# Patient Record
Sex: Male | Born: 1990 | Race: White | Hispanic: No | Marital: Married | Smoking: Light tobacco smoker
Health system: Southern US, Community
[De-identification: ages and names within clinical notes are randomized; demographics above are authoritative.]

---

## 2020-06-21 ENCOUNTER — Other Ambulatory Visit: Payer: Self-pay

## 2020-06-22 ENCOUNTER — Other Ambulatory Visit: Payer: Self-pay

## 2020-06-22 ENCOUNTER — Encounter: Payer: Self-pay | Admitting: Family Medicine

## 2020-06-22 ENCOUNTER — Ambulatory Visit (INDEPENDENT_AMBULATORY_CARE_PROVIDER_SITE_OTHER): Payer: Managed Care, Other (non HMO) | Admitting: Family Medicine

## 2020-06-22 VITALS — BP 120/76 | HR 96 | Temp 97.9°F | Ht 72.0 in | Wt 170.4 lb

## 2020-06-22 DIAGNOSIS — Z0000 Encounter for general adult medical examination without abnormal findings: Secondary | ICD-10-CM | POA: Diagnosis not present

## 2020-06-22 DIAGNOSIS — G8929 Other chronic pain: Secondary | ICD-10-CM

## 2020-06-22 DIAGNOSIS — M25511 Pain in right shoulder: Secondary | ICD-10-CM

## 2020-06-22 LAB — COMPREHENSIVE METABOLIC PANEL
ALT: 9 U/L (ref 0–53)
AST: 14 U/L (ref 0–37)
Albumin: 4.8 g/dL (ref 3.5–5.2)
Alkaline Phosphatase: 56 U/L (ref 39–117)
BUN: 9 mg/dL (ref 6–23)
CO2: 29 mEq/L (ref 19–32)
Calcium: 10.1 mg/dL (ref 8.4–10.5)
Chloride: 102 mEq/L (ref 96–112)
Creatinine, Ser: 0.88 mg/dL (ref 0.40–1.50)
GFR: 102.25 mL/min (ref >60.00)
Glucose, Bld: 92 mg/dL (ref 70–99)
Potassium: 4.2 mEq/L (ref 3.5–5.1)
Sodium: 138 mEq/L (ref 135–145)
Total Bilirubin: 0.8 mg/dL (ref 0.2–1.2)
Total Protein: 7.7 g/dL (ref 6.0–8.3)

## 2020-06-22 LAB — URINALYSIS, ROUTINE W REFLEX MICROSCOPIC
Bilirubin Urine: NEGATIVE
Hgb urine dipstick: NEGATIVE
Ketones, ur: NEGATIVE
Leukocytes,Ua: NEGATIVE
Nitrite: NEGATIVE
RBC / HPF: NONE SEEN (ref 0–?)
Specific Gravity, Urine: <=1.005 — AB (ref 1.000–1.030)
Total Protein, Urine: NEGATIVE
Urine Glucose: NEGATIVE
Urobilinogen, UA: 0.2 (ref 0.0–1.0)
WBC, UA: NONE SEEN (ref 0–?)
pH: 7 (ref 5.0–8.0)

## 2020-06-22 LAB — CBC
HCT: 46.2 % (ref 39.0–52.0)
Hemoglobin: 15.4 g/dL (ref 13.0–17.0)
MCHC: 33.4 g/dL (ref 30.0–36.0)
MCV: 92.9 fl (ref 78.0–100.0)
Platelets: 266 10*3/uL (ref 150.0–400.0)
RBC: 4.97 Mil/uL (ref 4.22–5.81)
RDW: 13.4 % (ref 11.5–15.5)
WBC: 5.5 10*3/uL (ref 4.0–10.5)

## 2020-06-22 LAB — LIPID PANEL
Cholesterol: 186 mg/dL (ref 0–200)
HDL: 78.3 mg/dL (ref >39.00)
LDL Cholesterol: 96 mg/dL (ref 0–99)
NonHDL: 107.4
Total CHOL/HDL Ratio: 2
Triglycerides: 56 mg/dL (ref 0.0–149.0)
VLDL: 11.2 mg/dL (ref 0.0–40.0)

## 2020-06-22 LAB — LDL CHOLESTEROL, DIRECT: Direct LDL: 94 mg/dL

## 2020-06-22 NOTE — Progress Notes (Signed)
New Patient Office Visit  Subjective:  Patient ID: James Davila, male    DOB: [birth year]  Age: 29 y.o. MRN: [id number]  CC:  Chief Complaint  Patient presents with  . Establish Care    New patient     HPI James Davila presents for establishment of care and a physical exam and discuss some medical issues and concerns.  Enjoys good health.  He has been having chronic right shoulder pain.  He is right-hand dominant.  There is relatively new stress in his life with a new job working for 10-hour days, recently married.  His father passed this past year and had suffered from malignant melanoma.  Patient does smoke but does so rarely.  He drinks alcohol 2-3 times weekly at most and no more than 1 or 2 servings in a given setting.  Having trouble finding time to exercise with his current work schedule.  Sometimes has problem with acne on his upper back.  History reviewed. No pertinent past medical history.  History reviewed. No pertinent surgical history.  Family History  Problem Relation Age of Onset  . Healthy Mother   . Pneumonia Father     Social History   Socioeconomic History  . Marital status: Married    Spouse name: Not on file  . Number of children: Not on file  . Years of education: Not on file  . Highest education level: Not on file  Occupational History  . Not on file  Tobacco Use  . Smoking status: Light Tobacco Smoker    Types: Cigarettes  . Smokeless tobacco: Never Used  Vaping Use  . Vaping Use: Never used  Substance and Sexual Activity  . Alcohol use: Yes    Comment: all drinks  . Drug use: Never  . Sexual activity: Yes  Other Topics Concern  . Not on file  Social History Narrative  . Not on file   Social Determinants of Health   Financial Resource Strain:   . Difficulty of Paying Living Expenses: Not on file  Food Insecurity:   . Worried About Programme researcher, broadcasting/film/video in the Last Year: Not on file  . Ran Out of Food in the Last Year: Not on file    Transportation Needs:   . Lack of Transportation (Medical): Not on file  . Lack of Transportation (Non-Medical): Not on file  Physical Activity:   . Days of Exercise per Week: Not on file  . Minutes of Exercise per Session: Not on file  Stress:   . Feeling of Stress : Not on file  Social Connections:   . Frequency of Communication with Friends and Family: Not on file  . Frequency of Social Gatherings with Friends and Family: Not on file  . Attends Religious Services: Not on file  . Active Member of Clubs or Organizations: Not on file  . Attends Banker Meetings: Not on file  . Marital Status: Not on file  Intimate Partner Violence:   . Fear of Current or Ex-Partner: Not on file  . Emotionally Abused: Not on file  . Physically Abused: Not on file  . Sexually Abused: Not on file    ROS Review of Systems  Constitutional: Negative.   HENT: Negative.   Eyes: Negative for photophobia and visual disturbance.  Respiratory: Negative.   Cardiovascular: Negative.   Gastrointestinal: Negative.   Endocrine: Negative for polyphagia and polyuria.  Genitourinary: Negative.   Musculoskeletal: Positive for arthralgias. Negative for gait problem and joint  swelling.  Allergic/Immunologic: Negative for immunocompromised state.  Neurological: Negative.   Hematological: Does not bruise/bleed easily.    Objective:   Today's Vitals: BP 120/76   Pulse 96   Temp 97.9 F (36.6 C) (Tympanic)   Ht 6' (1.829 m)   Wt 170 lb 6.4 oz (77.3 kg)   SpO2 98%   BMI 23.11 kg/m   Physical Exam Vitals and nursing note reviewed.  Constitutional:      General: He is not in acute distress.    Appearance: Normal appearance. He is normal weight. He is not ill-appearing, toxic-appearing or diaphoretic.  HENT:     Head: Normocephalic and atraumatic.     Right Ear: Tympanic membrane, ear canal and external ear normal.     Left Ear: Tympanic membrane, ear canal and external ear normal.      Mouth/Throat:     Mouth: Mucous membranes are moist.     Pharynx: Oropharynx is clear. No oropharyngeal exudate or posterior oropharyngeal erythema.  Eyes:     General: No scleral icterus.       Right eye: No discharge.        Left eye: No discharge.     Extraocular Movements: Extraocular movements intact.     Conjunctiva/sclera: Conjunctivae normal.     Pupils: Pupils are equal, round, and reactive to light.  Cardiovascular:     Rate and Rhythm: Normal rate and regular rhythm.  Pulmonary:     Effort: Pulmonary effort is normal.     Breath sounds: Normal breath sounds.  Abdominal:     General: Abdomen is flat. Bowel sounds are normal. There is no distension.     Palpations: There is no mass.     Tenderness: There is no abdominal tenderness. There is no guarding or rebound.     Hernia: No hernia is present. There is no hernia in the left inguinal area or right inguinal area.  Genitourinary:    Penis: No hypospadias, erythema, tenderness, discharge, swelling or lesions.      Testes: Normal.        Right: Mass, tenderness or swelling not present. Right testis is descended.        Left: Mass, tenderness or swelling not present. Left testis is descended.     Epididymis:     Right: Not inflamed or enlarged.     Left: Not inflamed or enlarged.  Musculoskeletal:     Cervical back: Normal range of motion. No rigidity or tenderness.     Right lower leg: No edema.     Left lower leg: No edema.  Lymphadenopathy:     Cervical: No cervical adenopathy.     Lower Body: No right inguinal adenopathy. No left inguinal adenopathy.  Skin:    General: Skin is warm and dry.  Neurological:     Mental Status: He is alert and oriented to person, place, and time.  Psychiatric:        Mood and Affect: Mood normal.        Behavior: Behavior normal.     Assessment & Plan:   Problem List Items Addressed This Visit      Other   Chronic right shoulder pain   Relevant Orders   Ambulatory referral  to Sports Medicine   Healthcare maintenance - Primary   Relevant Orders   CBC   Comprehensive metabolic panel   LDL cholesterol, direct   Lipid panel   Urinalysis, Routine w reflex microscopic   HIV Antibody (routine testing  w rflx)   Hepatitis C antibody      No outpatient encounter medications on file as of 06/22/2020.   No facility-administered encounter medications on file as of 06/22/2020.    Follow-up: Return in about 1 year (around 06/22/2021), or if symptoms worsen or fail to improve.  Given information on health maintenance disease prevention and mindfulness based stress reduction.  Asked him to try to find time for at least 30 minutes of exercise 5 days weekly. Mliss Sax, MD

## 2020-06-22 NOTE — Patient Instructions (Signed)
Health Maintenance, Male Adopting a healthy lifestyle and getting preventive care are important in promoting health and wellness. Ask your health care provider about:  The right schedule for you to have regular tests and exams.  Things you can do on your own to prevent diseases and keep yourself healthy. What should I know about diet, weight, and exercise? Eat a healthy diet   Eat a diet that includes plenty of vegetables, fruits, low-fat dairy products, and lean protein.  Do not eat a lot of foods that are high in solid fats, added sugars, or sodium. Maintain a healthy weight Body mass index (BMI) is a measurement that can be used to identify possible weight problems. It estimates body fat based on height and weight. Your health care provider can help determine your BMI and help you achieve or maintain a healthy weight. Get regular exercise Get regular exercise. This is one of the most important things you can do for your health. Most adults should:  Exercise for at least 150 minutes each week. The exercise should increase your heart rate and make you sweat (moderate-intensity exercise).  Do strengthening exercises at least twice a week. This is in addition to the moderate-intensity exercise.  Spend less time sitting. Even light physical activity can be beneficial. Watch cholesterol and blood lipids Have your blood tested for lipids and cholesterol at 29 years of age, then have this test every 5 years. You may need to have your cholesterol levels checked more often if:  Your lipid or cholesterol levels are high.  You are older than 29 years of age.  You are at high risk for heart disease. What should I know about cancer screening? Many types of cancers can be detected early and may often be prevented. Depending on your health history and family history, you may need to have cancer screening at various ages. This may include screening for:  Colorectal cancer.  Prostate  cancer.  Skin cancer.  Lung cancer. What should I know about heart disease, diabetes, and high blood pressure? Blood pressure and heart disease  High blood pressure causes heart disease and increases the risk of stroke. This is more likely to develop in people who have high blood pressure readings, are of African descent, or are overweight.  Talk with your health care provider about your target blood pressure readings.  Have your blood pressure checked: ? Every 3-5 years if you are 18-39 years of age. ? Every year if you are 40 years old or older.  If you are between the ages of 65 and 75 and are a current or former smoker, ask your health care provider if you should have a one-time screening for abdominal aortic aneurysm (AAA). Diabetes Have regular diabetes screenings. This checks your fasting blood sugar level. Have the screening done:  Once every three years after age 45 if you are at a normal weight and have a low risk for diabetes.  More often and at a younger age if you are overweight or have a high risk for diabetes. What should I know about preventing infection? Hepatitis B If you have a higher risk for hepatitis B, you should be screened for this virus. Talk with your health care provider to find out if you are at risk for hepatitis B infection. Hepatitis C Blood testing is recommended for:  Everyone born from 1945 through 1965.  Anyone with known risk factors for hepatitis C. Sexually transmitted infections (STIs)  You should be screened each year   for STIs, including gonorrhea and chlamydia, if: ? You are sexually active and are younger than 29 years of age. ? You are older than 29 years of age and your health care provider tells you that you are at risk for this type of infection. ? Your sexual activity has changed since you were last screened, and you are at increased risk for chlamydia or gonorrhea. Ask your health care provider if you are at risk.  Ask your  health care provider about whether you are at high risk for HIV. Your health care provider may recommend a prescription medicine to help prevent HIV infection. If you choose to take medicine to prevent HIV, you should first get tested for HIV. You should then be tested every 3 months for as long as you are taking the medicine. Follow these instructions at home: Lifestyle  Do not use any products that contain nicotine or tobacco, such as cigarettes, e-cigarettes, and chewing tobacco. If you need help quitting, ask your health care provider.  Do not use street drugs.  Do not share needles.  Ask your health care provider for help if you need support or information about quitting drugs. Alcohol use  Do not drink alcohol if your health care provider tells you not to drink.  If you drink alcohol: ? Limit how much you have to 0-2 drinks a day. ? Be aware of how much alcohol is in your drink. In the [place] one drink equals one 12 oz bottle of beer (355 mL), one 5 oz glass of wine (148 mL), or one 1 oz glass of hard liquor (44 mL). General instructions  Schedule regular health, dental, and eye exams.  Stay current with your vaccines.  Tell your health care provider if: ? You often feel depressed. ? You have ever been abused or do not feel safe at home. Summary  Adopting a healthy lifestyle and getting preventive care are important in promoting health and wellness.  Follow your health care provider's instructions about healthy diet, exercising, and getting tested or screened for diseases.  Follow your health care provider's instructions on monitoring your cholesterol and blood pressure. This information is not intended to replace advice given to you by your health care provider. Make sure you discuss any questions you have with your health care provider. Document Revised: 10/06/2018 Document Reviewed: 10/06/2018 Elsevier Patient Education  2020 [employer] Inc[employer]  Preventive Care 21-39 Years  Old, Male Preventive care refers to lifestyle choices and visits with your health care provider that can promote health and wellness. This includes:  A yearly physical exam. This is also called an annual well check.  Regular dental and eye exams.  Immunizations.  Screening for certain conditions.  Healthy lifestyle choices, such as eating a healthy diet, getting regular exercise, not using drugs or products that contain nicotine and tobacco, and limiting alcohol use. What can I expect for my preventive care visit? Physical exam Your health care provider will check:  Height and weight. These may be used to calculate body mass index (BMI), which is a measurement that tells if you are at a healthy weight.  Heart rate and blood pressure.  Your skin for abnormal spots. Counseling Your health care provider may ask you questions about:  Alcohol, tobacco, and drug use.  Emotional well-being.  Home and relationship well-being.  Sexual activity.  Eating habits.  Work and work environment. What immunizations do I need?  Influenza (flu) vaccine  This is recommended every year. Tetanus, diphtheria,   and pertussis (Tdap) vaccine  You may need a Td booster every 10 years. Varicella (chickenpox) vaccine  You may need this vaccine if you have not already been vaccinated. Human papillomavirus (HPV) vaccine  If recommended by your health care provider, you may need three doses over 6 months. Measles, mumps, and rubella (MMR) vaccine  You may need at least one dose of MMR. You may also need a second dose. Meningococcal conjugate (MenACWY) vaccine  One dose is recommended if you are 73-28 years old and a Market researcher living in a residence hall, or if you have one of several medical conditions. You may also need additional booster doses. Pneumococcal conjugate (PCV13) vaccine  You may need this if you have certain conditions and were not previously  vaccinated. Pneumococcal polysaccharide (PPSV23) vaccine  You may need one or two doses if you smoke cigarettes or if you have certain conditions. Hepatitis A vaccine  You may need this if you have certain conditions or if you travel or work in places where you may be exposed to hepatitis A. Hepatitis B vaccine  You may need this if you have certain conditions or if you travel or work in places where you may be exposed to hepatitis B. Haemophilus influenzae type b (Hib) vaccine  You may need this if you have certain risk factors. You may receive vaccines as individual doses or as more than one vaccine together in one shot (combination vaccines). Talk with your health care provider about the risks and benefits of combination vaccines. What tests do I need? Blood tests  Lipid and cholesterol levels. These may be checked every 5 years starting at age 80.  Hepatitis C test.  Hepatitis B test. Screening   Diabetes screening. This is done by checking your blood sugar (glucose) after you have not eaten for a while (fasting).  Sexually transmitted disease (STD) testing. Talk with your health care provider about your test results, treatment options, and if necessary, the need for more tests. Follow these instructions at home: Eating and drinking   Eat a diet that includes fresh fruits and vegetables, whole grains, lean protein, and low-fat dairy products.  Take vitamin and mineral supplements as recommended by your health care provider.  Do not drink alcohol if your health care provider tells you not to drink.  If you drink alcohol: ? Limit how much you have to 0-2 drinks a day. ? Be aware of how much alcohol is in your drink. In the [place] one drink equals one 12 oz bottle of beer (355 mL), one 5 oz glass of wine (148 mL), or one 1 oz glass of hard liquor (44 mL). Lifestyle  Take daily care of your teeth and gums.  Stay active. Exercise for at least 30 minutes on 5 or more days  each week.  Do not use any products that contain nicotine or tobacco, such as cigarettes, e-cigarettes, and chewing tobacco. If you need help quitting, ask your health care provider.  If you are sexually active, practice safe sex. Use a condom or other form of protection to prevent STIs (sexually transmitted infections). What's next?  Go to your health care provider once a year for a well check visit.  Ask your health care provider how often you should have your eyes and teeth checked.  Stay up to date on all vaccines. This information is not intended to replace advice given to you by your health care provider. Make sure you discuss any questions you  have with your health care provider. Document Revised: 10/07/2018 Document Reviewed: 10/07/2018 Elsevier Patient Education  2020 [employer] Inc.  Mindfulness-Based Stress Reduction Mindfulness-based stress reduction (MBSR) is a program that helps people learn to practice mindfulness. Mindfulness is the practice of intentionally paying attention to the present moment. It can be learned and practiced through techniques such as education, breathing exercises, meditation, and yoga. MBSR includes several mindfulness techniques in one program. MBSR works best when you understand the treatment, are willing to try new things, and can commit to spending time practicing what you learn. MBSR training may include learning about:  How your emotions, thoughts, and reactions affect your body.  New ways to respond to things that cause negative thoughts to start (triggers).  How to notice your thoughts and let go of them.  Practicing awareness of everyday things that you normally do without thinking.  The techniques and goals of different types of meditation. What are the benefits of MBSR? MBSR can have many benefits, which include helping you to:  Develop self-awareness. This refers to knowing and understanding yourself.  Learn skills and attitudes that  help you to participate in your own health care.  Learn new ways to care for yourself.  Be more accepting about how things are, and let things go.  Be less judgmental and approach things with an open mind.  Be patient with yourself and trust yourself more. MBSR has also been shown to:  Reduce negative emotions, such as depression and anxiety.  Improve memory and focus.  Change how you sense and approach pain.  Boost your body's ability to fight infections.  Help you connect better with other people.  Improve your sense of well-being. Follow these instructions at home:   Find a local in-person or online MBSR program.  Set aside some time regularly for mindfulness practice.  Find a mindfulness practice that works best for you. This may include one or more of the following: ? Meditation. Meditation involves focusing your mind on a certain thought or activity. ? Breathing awareness exercises. These help you to stay present by focusing on your breath. ? Body scan. For this practice, you lie down and pay attention to each part of your body from head to toe. You can identify tension and soreness and intentionally relax parts of your body. ? Yoga. Yoga involves stretching and breathing, and it can improve your ability to move and be flexible. It can also provide an experience of testing your body's limits, which can help you release stress. ? Mindful eating. This way of eating involves focusing on the taste, texture, color, and smell of each bite of food. Because this slows down eating and helps you feel full sooner, it can be an important part of a weight-loss plan.  Find a podcast or recording that provides guidance for breathing awareness, body scan, or meditation exercises. You can listen to these any time when you have a free moment to rest without distractions.  Follow your treatment plan as told by your health care provider. This may include taking regular medicines and making  changes to your diet or lifestyle as recommended. How to practice mindfulness To do a basic awareness exercise:  Find a comfortable place to sit.  Pay attention to the present moment. Observe your thoughts, feelings, and surroundings just as they are.  Avoid placing judgment on yourself, your feelings, or your surroundings. Make note of any judgment that comes up, and let it go.  Your mind may wander,   and that is okay. Make note of when your thoughts drift, and return your attention to the present moment. To do basic mindfulness meditation:  Find a comfortable place to sit. This may include a stable chair or a firm floor cushion. ? Sit upright with your back straight. Let your arms fall next to your side with your hands resting on your legs. ? If sitting in a chair, rest your feet flat on the floor. ? If sitting on a cushion, cross your legs in front of you.  Keep your head in a neutral position with your chin dropped slightly. Relax your jaw and rest the tip of your tongue on the roof of your mouth. Drop your gaze to the floor. You can close your eyes if you like.  Breathe normally and pay attention to your breath. Feel the air moving in and out of your nose. Feel your belly expanding and relaxing with each breath.  Your mind may wander, and that is okay. Make note of when your thoughts drift, and return your attention to your breath.  Avoid placing judgment on yourself, your feelings, or your surroundings. Make note of any judgment or feelings that come up, let them go, and bring your attention back to your breath.  When you are ready, lift your gaze or open your eyes. Pay attention to how your body feels after the meditation. Where to find more information You can find more information about MBSR from:  Your health care provider.  Community-based meditation centers or programs.  Programs offered near you. Summary  Mindfulness-based stress reduction (MBSR) is a program that  teaches you how to intentionally pay attention to the present moment. It is used with other treatments to help you cope better with daily stress, emotions, and pain.  MBSR focuses on developing self-awareness, which allows you to respond to life stress without judgment or negative emotions.  MBSR programs may involve learning different mindfulness practices, such as breathing exercises, meditation, yoga, body scan, or mindful eating. Find a mindfulness practice that works best for you, and set aside time for it on a regular basis. This information is not intended to replace advice given to you by your health care provider. Make sure you discuss any questions you have with your health care provider. Document Revised: 09/25/2017 Document Reviewed: 02/19/2017 Elsevier Patient Education  2020 [employer] Inc.  

## 2020-06-25 LAB — HEPATITIS C ANTIBODY
Hepatitis C Ab: NONREACTIVE
SIGNAL TO CUT-OFF: 0.01 (ref <1.00)

## 2020-06-25 LAB — HIV ANTIBODY (ROUTINE TESTING W REFLEX): HIV 1&2 Ab, 4th Generation: NONREACTIVE

## 2020-07-05 ENCOUNTER — Encounter: Payer: Self-pay | Admitting: Family Medicine

## 2020-07-05 ENCOUNTER — Ambulatory Visit: Payer: Self-pay

## 2020-07-05 ENCOUNTER — Ambulatory Visit (INDEPENDENT_AMBULATORY_CARE_PROVIDER_SITE_OTHER): Payer: Managed Care, Other (non HMO)

## 2020-07-05 ENCOUNTER — Other Ambulatory Visit: Payer: Self-pay

## 2020-07-05 ENCOUNTER — Ambulatory Visit (INDEPENDENT_AMBULATORY_CARE_PROVIDER_SITE_OTHER): Payer: Managed Care, Other (non HMO) | Admitting: Family Medicine

## 2020-07-05 VITALS — BP 120/76 | HR 86 | Ht 72.0 in | Wt 169.4 lb

## 2020-07-05 DIAGNOSIS — M25511 Pain in right shoulder: Secondary | ICD-10-CM

## 2020-07-05 DIAGNOSIS — M25311 Other instability, right shoulder: Secondary | ICD-10-CM | POA: Diagnosis not present

## 2020-07-05 DIAGNOSIS — G8929 Other chronic pain: Secondary | ICD-10-CM

## 2020-07-05 NOTE — Patient Instructions (Addendum)
Thank you for coming in today.  Plan for PT.  Get xray today.  Recheck in 6 weeks as needed.

## 2020-07-05 NOTE — Progress Notes (Signed)
Subjective:    I'm seeing this patient as a consultation for:  Dr. Doreene Burke. Note will be routed back to referring provider/PCP.  CC: R shoulder pain  I, Molly Weber, LAT, ATC, am serving as scribe for Dr. Clementeen Graham.  HPI: Pt is a 29 y/o male presenting w/ c/o chronic R shoulder pain.  He states that he is not having any pain in his R shoulder currently but notes that he has "instability" in his shoulder despite not subluxing or dislocating his shoulder.  He locates the pain to his R post shoulder when she has it.  He likes to rock climb, mountain bike, play VB.  R shoulder mechanical symptoms: yes Aggravating factors: Nothing currently Treatments tried: self-prescribed HEP; ice; IBU  Past medical history, Surgical history, Family history, Social history, Allergies, and medications have been entered into the medical record, reviewed.   Review of Systems: No new headache, visual changes, nausea, vomiting, diarrhea, constipation, dizziness, abdominal pain, skin rash, fevers, chills, night sweats, weight loss, swollen lymph nodes, body aches, joint swelling, muscle aches, chest pain, shortness of breath, mood changes, visual or auditory hallucinations.   Objective:    Vitals:   07/05/20 0946  BP: 120/76  Pulse: 86  SpO2: 98%   General: Well Developed, well nourished, and in no acute distress.  Neuro/Psych: Alert and oriented x3, extra-ocular muscles intact, able to move all 4 extremities, sensation grossly intact. Skin: Warm and dry, no rashes noted.  Respiratory: Not using accessory muscles, speaking in full sentences, trachea midline.  Cardiovascular: Pulses palpable, no extremity edema. Abdomen: Does not appear distended. MSK: Right shoulder normal-appearing normal motion nontender. Palpable click with shoulder abduction and external rotation. Intact strength abduction external/internal rotation. Negative anterior posterior apprehension test. Negative Hawkins and Neer's test.   Negative empty can test. Negative Yergason's and speeds test. Positive O'Brien's test.  Lab and Radiology Results Diagnostic Limited MSK Ultrasound of: Right shoulder Biceps tendon intact normal-appearing Subscapularis tendon normal-appearing Supraspinatus tendon normal-appearing Small subacromial bursitis present. Infraspinatus tendon normal-appearing AC joint normal-appearing Impression: Small subacromial bursitis otherwise normal  X-ray images right shoulder obtained today personally independently reviewed No fractures or significant degenerative changes normal-appearing. Await formal radiology review    Impression and Recommendations:    Assessment and Plan: 29 y.o. male with right shoulder pain and instability.  Functionally a little bit instability with significant shoulder dominant activity like rockclimbing.  He should do quite well with physical therapy for shoulder stabilization.  Referral ordered today.  If not better next step would be MRI arthrogram to further characterize shoulder instability and look for labral tear.Marland Kitchen  PDMP not reviewed this encounter. Orders Placed This Encounter  Procedures  . [place] LIMITED JOINT SPACE STRUCTURES UP RIGHT(NO LINKED CHARGES)    Order Specific Question:   Reason for Exam (SYMPTOM  OR DIAGNOSIS REQUIRED)    Answer:   R shoulder pain    Order Specific Question:   Preferred imaging location?    Answer:   Adult [facility]  . DG Shoulder Right    Standing Status:   Future    Number of Occurrences:   1    Standing Expiration Date:   07/05/2021    Order Specific Question:   Reason for Exam (SYMPTOM  OR DIAGNOSIS REQUIRED)    Answer:   eval shoulder pain    Order Specific Question:   Preferred imaging location?    Answer:   [facility]    Order Specific Question:  Radiology Contrast Protocol - do NOT remove file path    Answer:   [email or web address]  .  Ambulatory referral to Physical Therapy    Referral Priority:   Routine    Referral Type:   Physical Medicine    Referral Reason:   Specialty Services Required    Requested Specialty:   Physical Therapy    Number of Visits Requested:   1   No orders of the defined types were placed in this encounter.   Discussed warning signs or symptoms. Please see discharge instructions. Patient expresses understanding.   The above documentation has been reviewed and is accurate and complete Clementeen Graham, M.D.

## 2020-07-06 NOTE — Progress Notes (Signed)
X-ray right shoulder looks normal-appearing to radiology.

## 2021-06-27 ENCOUNTER — Other Ambulatory Visit: Payer: Self-pay

## 2021-06-27 ENCOUNTER — Ambulatory Visit (INDEPENDENT_AMBULATORY_CARE_PROVIDER_SITE_OTHER): Payer: Managed Care, Other (non HMO) | Admitting: Family Medicine

## 2021-06-27 ENCOUNTER — Encounter: Payer: Self-pay | Admitting: Family Medicine

## 2021-06-27 VITALS — HR 93 | Temp 97.9°F | Ht 73.0 in | Wt 184.0 lb

## 2021-06-27 DIAGNOSIS — M545 Low back pain, unspecified: Secondary | ICD-10-CM | POA: Insufficient documentation

## 2021-06-27 DIAGNOSIS — R5383 Other fatigue: Secondary | ICD-10-CM | POA: Diagnosis not present

## 2021-06-27 DIAGNOSIS — F418 Other specified anxiety disorders: Secondary | ICD-10-CM

## 2021-06-27 DIAGNOSIS — Z0000 Encounter for general adult medical examination without abnormal findings: Secondary | ICD-10-CM | POA: Diagnosis not present

## 2021-06-27 LAB — COMPREHENSIVE METABOLIC PANEL
ALT: 13 U/L (ref 0–53)
AST: 15 U/L (ref 0–37)
Albumin: 4.4 g/dL (ref 3.5–5.2)
Alkaline Phosphatase: 47 U/L (ref 39–117)
BUN: 8 mg/dL (ref 6–23)
CO2: 31 mEq/L (ref 19–32)
Calcium: 9.9 mg/dL (ref 8.4–10.5)
Chloride: 102 mEq/L (ref 96–112)
Creatinine, Ser: 0.86 mg/dL (ref 0.40–1.50)
GFR: 116.44 mL/min (ref >60.00)
Glucose, Bld: 80 mg/dL (ref 70–99)
Potassium: 4.2 mEq/L (ref 3.5–5.1)
Sodium: 137 mEq/L (ref 135–145)
Total Bilirubin: 0.8 mg/dL (ref 0.2–1.2)
Total Protein: 7 g/dL (ref 6.0–8.3)

## 2021-06-27 LAB — LIPID PANEL
Cholesterol: 176 mg/dL (ref 0–200)
HDL: 61 mg/dL (ref >39.00)
LDL Cholesterol: 99 mg/dL (ref 0–99)
NonHDL: 115.49
Total CHOL/HDL Ratio: 3
Triglycerides: 80 mg/dL (ref 0.0–149.0)
VLDL: 16 mg/dL (ref 0.0–40.0)

## 2021-06-27 LAB — URINALYSIS, ROUTINE W REFLEX MICROSCOPIC
Bilirubin Urine: NEGATIVE
Hgb urine dipstick: NEGATIVE
Ketones, ur: NEGATIVE
Leukocytes,Ua: NEGATIVE
Nitrite: NEGATIVE
RBC / HPF: NONE SEEN (ref 0–?)
Specific Gravity, Urine: 1.01 (ref 1.000–1.030)
Total Protein, Urine: NEGATIVE
Urine Glucose: NEGATIVE
Urobilinogen, UA: 0.2 (ref 0.0–1.0)
WBC, UA: NONE SEEN (ref 0–?)
pH: 7.5 (ref 5.0–8.0)

## 2021-06-27 LAB — CBC
HCT: 40.8 % (ref 39.0–52.0)
Hemoglobin: 13.9 g/dL (ref 13.0–17.0)
MCHC: 34 g/dL (ref 30.0–36.0)
MCV: 91.6 fl (ref 78.0–100.0)
Platelets: 229 10*3/uL (ref 150.0–400.0)
RBC: 4.45 Mil/uL (ref 4.22–5.81)
RDW: 13.5 % (ref 11.5–15.5)
WBC: 3.9 10*3/uL — ABNORMAL LOW (ref 4.0–10.5)

## 2021-06-27 NOTE — Progress Notes (Signed)
Established Patient Office Visit  Subjective:  Patient ID: James Davila, male    DOB: Mar 21, 1991  Age: 30 y.o. MRN: [id number]  CC:  Chief Complaint  Patient presents with   Annual Exam    CPE/labs.  Fasting today.   C/o prolems focusing lately, lower back pain and wax in ears.    HPI James Davila presents for a physical exam and follow-up of some issues he has been having.  Tells of sadness anxiety decreased motivation some issue with focus.  He is married and things are going well at home.  He does not use illicit drugs.  Consumes alcohol every other night and has no more than 2 servings.  Continues to work long hours.  Gets about 7 hours of sleep a night he tells me.  He is not exercising regularly.  His wife's doctor sent her to [facility].  They put her in it seemed to help.  Tells of ongoing bilateral lower back pain.  It is nonradiating.  There is no numbness or tingling or change in his bowel or bladder function.  He requests that I check his testosterone levels.  Libido is somewhat diminished and he feels as though his energy levels are as well.  No past medical history on file.  No past surgical history on file.  Family History  Problem Relation Age of Onset   Healthy Mother    Pneumonia Father     Social History   Socioeconomic History   Marital status: Married    Spouse name: Not on file   Number of children: Not on file   Years of education: Not on file   Highest education level: Not on file  Occupational History   Not on file  Tobacco Use   Smoking status: Light Smoker    Types: Cigarettes   Smokeless tobacco: Never   Tobacco comments:    1 cigarette rarely  Vaping Use   Vaping Use: Never used  Substance and Sexual Activity   Alcohol use: Yes    Comment: 1-2 drinks 2 to 3 times weekly.    Drug use: Never   Sexual activity: Yes  Other Topics Concern   Not on file  Social History Narrative   Not on file   Social Determinants of  Health   Financial Resource Strain: Not on file  Food Insecurity: Not on file  Transportation Needs: Not on file  Physical Activity: Not on file  Stress: Not on file  Social Connections: Not on file  Intimate Partner Violence: Not on file    No outpatient medications prior to visit.   No facility-administered medications prior to visit.    No Known Allergies  ROS Review of Systems  Constitutional:  Negative for chills, diaphoresis, fatigue, fever and unexpected weight change.  HENT: Negative.    Eyes:  Negative for photophobia and visual disturbance.  Respiratory: Negative.    Cardiovascular: Negative.   Gastrointestinal: Negative.   Genitourinary: Negative.   Musculoskeletal:  Positive for back pain. Negative for gait problem.  Skin:  Negative for color change.  Neurological:  Negative for weakness and numbness.     Depression screen [facility] 2/9 06/27/2021 06/27/2021 06/22/2020  Decreased Interest 2 - 1  Down, Depressed, Hopeless 1 1 1   PHQ - 2 Score 3 1 2   Altered sleeping 2 - 1  Tired, decreased energy 3 - 2  Change in appetite 2 - 2  Feeling bad or failure about yourself  1 -  1  Trouble concentrating 3 - 1  Moving slowly or fidgety/restless 1 - 1  Suicidal thoughts 0 - 0  PHQ-9 Score 15 - 10  Difficult doing work/chores Extremely dIfficult - Somewhat difficult   GAD 7 : Generalized Anxiety Score 06/27/2021 06/22/2020  Nervous, Anxious, on Edge 1 1  Control/stop worrying 1 0  Worry too much - different things 3 2  Trouble relaxing 2 1  Restless 1 0  Easily annoyed or irritable 3 1  Afraid - awful might happen 1 0  Total GAD 7 Score 12 5  Anxiety Difficulty Very difficult Somewhat difficult      Objective:    Physical Exam Vitals and nursing note reviewed.  Constitutional:      General: He is not in acute distress.    Appearance: Normal appearance. He is normal weight. He is not ill-appearing, toxic-appearing or diaphoretic.  HENT:     Head: Normocephalic and  atraumatic.     Right Ear: Tympanic membrane, ear canal and external ear normal.     Left Ear: Tympanic membrane, ear canal and external ear normal.     Mouth/Throat:     Mouth: Mucous membranes are moist.     Pharynx: Oropharynx is clear. No oropharyngeal exudate or posterior oropharyngeal erythema.  Eyes:     General: No scleral icterus.       Right eye: No discharge.        Left eye: No discharge.     Extraocular Movements: Extraocular movements intact.     Conjunctiva/sclera: Conjunctivae normal.     Pupils: Pupils are equal, round, and reactive to light.  Cardiovascular:     Rate and Rhythm: Normal rate and regular rhythm.  Pulmonary:     Effort: Pulmonary effort is normal.     Breath sounds: Normal breath sounds.  Abdominal:     General: Abdomen is flat. Bowel sounds are normal. There is no distension.     Palpations: Abdomen is soft. There is no mass.     Tenderness: There is no abdominal tenderness. There is no guarding or rebound.     Hernia: No hernia is present.  Musculoskeletal:     Cervical back: No rigidity or tenderness.     Lumbar back: No deformity or tenderness. Normal range of motion.  Lymphadenopathy:     Cervical: No cervical adenopathy.  Skin:    General: Skin is warm and dry.  Neurological:     Mental Status: He is alert and oriented to person, place, and time.     Motor: Motor function is intact. No weakness.  Psychiatric:        Mood and Affect: Mood normal.        Behavior: Behavior normal.    Pulse 93   Temp 97.9 F (36.6 C) (Temporal)   Ht 6\' 1"  (1.854 m)   Wt 184 lb (83.5 kg)   SpO2 99%   BMI 24.28 kg/m  Wt Readings from Last 3 Encounters:  06/27/21 184 lb (83.5 kg)  07/05/20 169 lb 6.4 oz (76.8 kg)  06/22/20 170 lb 6.4 oz (77.3 kg)     Health Maintenance Due  Topic Date Due   Pneumococcal Vaccine 81-75 Years old (1 - PCV) Never done   TETANUS/TDAP  09/19/2018    There are no preventive care reminders to display for this  patient.  No results found for: TSH Lab Results  Component Value Date   WBC 5.5 06/22/2020   HGB 15.4 06/22/2020  HCT 46.2 06/22/2020   MCV 92.9 06/22/2020   PLT 266.0 06/22/2020   Lab Results  Component Value Date   NA 138 06/22/2020   K 4.2 06/22/2020   CO2 29 06/22/2020   GLUCOSE 92 06/22/2020   BUN 9 06/22/2020   CREATININE 0.88 06/22/2020   BILITOT 0.8 06/22/2020   ALKPHOS 56 06/22/2020   AST 14 06/22/2020   ALT 9 06/22/2020   PROT 7.7 06/22/2020   ALBUMIN 4.8 06/22/2020   CALCIUM 10.1 06/22/2020   GFR 102.25 06/22/2020   Lab Results  Component Value Date   CHOL 186 06/22/2020   Lab Results  Component Value Date   HDL 78.30 06/22/2020   Lab Results  Component Value Date   LDLCALC 96 06/22/2020   Lab Results  Component Value Date   TRIG 56.0 06/22/2020   Lab Results  Component Value Date   CHOLHDL 2 06/22/2020   No results found for: HGBA1C    Assessment & Plan:   Problem List Items Addressed This Visit       Other   Healthcare maintenance - Primary   Relevant Orders   CBC   Comprehensive metabolic panel   Lipid panel   Urinalysis, Routine w reflex microscopic   Anxiety with depression   Relevant Orders   Ambulatory referral to Psychology   Bilateral low back pain without sciatica   Fatigue   Relevant Orders   Testosterone Total,Free,Bio, Males-([facility])    No orders of the defined types were placed in this encounter.   Follow-up: Return in about 6 months (around 12/25/2021).  Patient was given information on health maintenance and disease prevention.  Given information on mindfulness based stress reduction.  Offered medication for his significantly positive PHQ-9 and GAD-7.  He seemed a little hesitant to consider medicine and was open to the idea of talking therapy.  Given information on back pain and exercises to relieve.  He thinks that strengthening in his core would improve his back pain.  I agree.  Mliss Sax, MD

## 2021-06-28 LAB — TESTOSTERONE TOTAL,FREE,BIO, MALES
Albumin: 4.3 g/dL (ref 3.6–5.1)
Sex Hormone Binding: 41 nmol/L (ref 10–50)
Testosterone, Bioavailable: 123.3 ng/dL (ref 110.0–575.0)
Testosterone, Free: 62.6 pg/mL (ref 46.0–224.0)
Testosterone: 545 ng/dL (ref 250–827)

## 2021-12-12 ENCOUNTER — Ambulatory Visit: Payer: Managed Care, Other (non HMO) | Admitting: Family Medicine

## 2021-12-12 ENCOUNTER — Encounter: Payer: Self-pay | Admitting: Family Medicine

## 2021-12-12 ENCOUNTER — Other Ambulatory Visit: Payer: Self-pay

## 2021-12-12 ENCOUNTER — Ambulatory Visit (INDEPENDENT_AMBULATORY_CARE_PROVIDER_SITE_OTHER): Payer: Managed Care, Other (non HMO)

## 2021-12-12 VITALS — BP 140/78 | HR 90 | Temp 97.2°F | Ht 73.0 in | Wt 176.0 lb

## 2021-12-12 DIAGNOSIS — G8929 Other chronic pain: Secondary | ICD-10-CM

## 2021-12-12 DIAGNOSIS — M545 Low back pain, unspecified: Secondary | ICD-10-CM

## 2021-12-12 DIAGNOSIS — H6120 Impacted cerumen, unspecified ear: Secondary | ICD-10-CM

## 2021-12-12 MED ORDER — MELOXICAM 7.5 MG PO TABS: 7.5000 mg | ORAL_TABLET | Freq: Every day | ORAL | 0 refills | Status: DC

## 2021-12-12 MED ORDER — CLEARCANAL EAR WAX REMOVAL 6.5 % OT KIT: PACK | OTIC | 2 refills | Status: AC

## 2021-12-12 NOTE — Progress Notes (Signed)
Established Patient Office Visit  Subjective:  Patient ID: James Davila, male    DOB: 02-18-1991  Age: 31 y.o. MRN: [id number]  CC:  Chief Complaint  Patient presents with   Back Pain    C/O back pain that come and go x few months. Would like ears checked feel clogged up.     HPI James Davila presents for evaluation of a 5-year history of ongoing lower back pain.  Usually it predominates to the left but it may be centralized down in the upper pelvis area.  Denies radiation of pain from the back into the legs.  Denies change in bowel or bladder functions.  Denies weakness or paresthesias in the lower extremities.  Knows of no specific injury.  He did start noticing lower back pain after he took a run on a steep trail with a friend of his.  Denies traumatic injury.  Works in an [employer] and really does not engage in heavy lifting.  He does workout in the gym some but says that he has not done deadlifts in years.  Admits that overall pattern of his lower back pain seems to be worsening over these last 5 years.  Assures me that his head is in a better place now.  There is less stress and anxiety in his life he has been able to work some things out.  No past medical history on file.  No past surgical history on file.  Family History  Problem Relation Age of Onset   Healthy Mother    Pneumonia Father     Social History   Socioeconomic History   Marital status: Married    Spouse name: Not on file   Number of children: Not on file   Years of education: Not on file   Highest education level: Not on file  Occupational History   Not on file  Tobacco Use   Smoking status: Light Smoker    Types: Cigarettes   Smokeless tobacco: Never   Tobacco comments:    1 cigarette rarely  Vaping Use   Vaping Use: Never used  Substance and Sexual Activity   Alcohol use: Yes    Comment: 1-2 drinks 2 to 3 times weekly.    Drug use: Never   Sexual activity: Yes  Other Topics Concern   Not on  file  Social History Narrative   Not on file   Social Determinants of Health   Financial Resource Strain: Not on file  Food Insecurity: Not on file  Transportation Needs: Not on file  Physical Activity: Not on file  Stress: Not on file  Social Connections: Not on file  Intimate Partner Violence: Not on file    Outpatient Medications Prior to Visit  Medication Sig Dispense Refill   amphetamine-dextroamphetamine (ADDERALL XR) 10 MG 24 hr capsule Take 10 mg by mouth every morning.     No facility-administered medications prior to visit.    No Known Allergies  ROS Review of Systems  Constitutional:  Negative for chills, diaphoresis, fatigue, fever and unexpected weight change.  HENT: Negative.    Eyes:  Negative for photophobia and visual disturbance.  Respiratory: Negative.    Cardiovascular: Negative.   Gastrointestinal: Negative.   Genitourinary: Negative.   Musculoskeletal:  Positive for back pain. Negative for gait problem.  Neurological:  Negative for speech difficulty, weakness and numbness.     Depression screen [facility] 2/9 12/12/2021 12/12/2021 06/27/2021  Decreased Interest 1 0 2  Down, Depressed, Hopeless 0  0 1  PHQ - 2 Score 1 0 3  Altered sleeping 1 - 2  Tired, decreased energy 1 - 3  Change in appetite 0 - 2  Feeling bad or failure about yourself  0 - 1  Trouble concentrating 1 - 3  Moving slowly or fidgety/restless 1 - 1  Suicidal thoughts 0 - 0  PHQ-9 Score 5 - 15  Difficult doing work/chores Somewhat difficult - Extremely dIfficult     Objective:    Physical Exam Vitals and nursing note reviewed.  Constitutional:      General: He is not in acute distress.    Appearance: Normal appearance. He is normal weight. He is not ill-appearing, toxic-appearing or diaphoretic.  HENT:     Head: Normocephalic and atraumatic.  Eyes:     General: No scleral icterus.       Right eye: No discharge.        Left eye: No discharge.     Extraocular Movements:  Extraocular movements intact.     Conjunctiva/sclera: Conjunctivae normal.  Cardiovascular:     Rate and Rhythm: Normal rate and regular rhythm.  Pulmonary:     Effort: Pulmonary effort is normal.     Breath sounds: Normal breath sounds.  Musculoskeletal:     Lumbar back: No spasms or tenderness. Decreased range of motion. Positive right straight leg raise test and positive left straight leg raise test.     Right lower leg: No edema.     Left lower leg: No edema.     Comments: Reports pain in the lower back with straight leg raises.  Decreased flexion, extension and rotation.  Skin:    General: Skin is warm and dry.  Neurological:     Mental Status: He is alert and oriented to person, place, and time.     Motor: No weakness.     Deep Tendon Reflexes:     Reflex Scores:      Patellar reflexes are 2+ on the right side and 2+ on the left side.      Achilles reflexes are 1+ on the right side and 1+ on the left side. Psychiatric:        Mood and Affect: Mood normal.        Behavior: Behavior normal.    BP 140/78 (BP Location: Right Arm, Patient Position: Sitting, Cuff Size: Normal)    Pulse 90    Temp (!) 97.2 F (36.2 C) (Temporal)    Ht $R'6\' 1"'qb$  (1.854 m)    Wt 176 lb (79.8 kg)    SpO2 99%    BMI 23.22 kg/m  Wt Readings from Last 3 Encounters:  12/12/21 176 lb (79.8 kg)  06/27/21 184 lb (83.5 kg)  07/05/20 169 lb 6.4 oz (76.8 kg)     There are no preventive care reminders to display for this patient.  There are no preventive care reminders to display for this patient.  No results found for: TSH Lab Results  Component Value Date   WBC 3.9 (L) 06/27/2021   HGB 13.9 06/27/2021   HCT 40.8 06/27/2021   MCV 91.6 06/27/2021   PLT 229.0 06/27/2021   Lab Results  Component Value Date   NA 137 06/27/2021   K 4.2 06/27/2021   CO2 31 06/27/2021   GLUCOSE 80 06/27/2021   BUN 8 06/27/2021   CREATININE 0.86 06/27/2021   BILITOT 0.8 06/27/2021   ALKPHOS 47 06/27/2021   AST 15  06/27/2021   ALT 13 06/27/2021  PROT 7.0 06/27/2021   ALBUMIN 4.4 06/27/2021   CALCIUM 9.9 06/27/2021   GFR 116.44 06/27/2021   Lab Results  Component Value Date   CHOL 176 06/27/2021   Lab Results  Component Value Date   HDL 61.00 06/27/2021   Lab Results  Component Value Date   LDLCALC 99 06/27/2021   Lab Results  Component Value Date   TRIG 80.0 06/27/2021   Lab Results  Component Value Date   CHOLHDL 3 06/27/2021   No results found for: HGBA1C    Assessment & Plan:   Problem List Items Addressed This Visit       Other   Chronic bilateral low back pain without sciatica - Primary   Relevant Medications   meloxicam (MOBIC) 7.5 MG tablet   Other Relevant Orders   Ambulatory referral to Sports Medicine   Cerumen in auditory canal on examination   Relevant Medications   Carbamide Peroxide-Saline (CLEARCANAL EAR WAX REMOVAL) 6.5 % KIT    Meds ordered this encounter  Medications   Carbamide Peroxide-Saline (CLEARCANAL EAR WAX REMOVAL) 6.5 % KIT    Sig: Follow instructions on kit.    Dispense:  1 kit    Refill:  2   meloxicam (MOBIC) 7.5 MG tablet    Sig: Take 1 tablet (7.5 mg total) by mouth daily.    Dispense:  30 tablet    Refill:  0    Follow-up: Return in about 6 months (around 06/11/2022).  Due to the length of symptoms we will check plain films and then an MRI of his lower back.  Sports medicine referral for guidance.  Physical therapy and ongoing management.  Information was given on chronic back pain and the management thereof.  We will start Mobic 7.5 mg daily.  Information was given on earwax buildup.  Recommend earwax removal kit with bulb.  Advised not to use Q-tips.  Continue follow-up with [facility].  Libby Maw, MD

## 2021-12-27 ENCOUNTER — Telehealth: Payer: Self-pay | Admitting: Family Medicine

## 2021-12-27 ENCOUNTER — Ambulatory Visit: Payer: Managed Care, Other (non HMO) | Admitting: Family Medicine

## 2021-12-27 NOTE — Telephone Encounter (Signed)
Pt called and said he would like to transfer care from Dr Doreene Burke to Dr Alvy Bimler because of location being closer, Is this okay?  ?

## 2021-12-28 ENCOUNTER — Ambulatory Visit (HOSPITAL_BASED_OUTPATIENT_CLINIC_OR_DEPARTMENT_OTHER): Payer: Managed Care, Other (non HMO)

## 2021-12-29 NOTE — Telephone Encounter (Signed)
Not okay with me.  I will not be accepting this transfer of care.  Thanks.

## 2022-01-02 NOTE — Progress Notes (Signed)
? ?  I, Christoper Fabian, LAT, ATC, am serving as scribe for Dr. Clementeen Graham. ? ?James Davila is a 31 y.o. male who presents to [facility] today for chronic LBP x 5 years.  He was most recently seen by his PCP for these c/o and was referred for a lumbar MRI that he has not yet completed.  He reports that he typically has chronic low back soreness but then had a more acute episode of low back pain that worsened one morning when he got out of bed.  He locates his pain to his lower back, chronically on the L side. ? ?Radiating pain: intermittently into B LE ?LE numbness/tingling: no ?Aggravating factors: hip hinge; SLR type position; general activity ?Treatments tried: meloxicam; sauna ? ?Diagnostic testing: L-spine MRI ordered but not yet completed; L-spine XR- 12/12/21 ? ? ?Pertinent review of systems: No fevers or chills ? ?Relevant historical information: Anxiety and depression. ? ? ?Exam:  ?BP 120/74 (BP Location: Right Arm, Patient Position: Sitting, Cuff Size: Normal)   Pulse 90   Ht 6\' 1"  (1.854 m)   Wt 176 lb 6.4 oz (80 kg)   SpO2 98%   BMI 23.27 kg/m?  ?General: Well Developed, well nourished, and in no acute distress.  ? ?MSK: L-spine: Nontender midline. ?Mildly tender to palpation right lumbar paraspinal musculature and SI joint region. ?Normal lumbar motion with the exception of extension which is slightly limited and mildly painful. ?Lower extremity strength is intact. ?Reflexes are intact. ? ? ? ?Lab and Radiology Results ? ?EXAM: ?LUMBAR SPINE - COMPLETE 4+ VIEW ?  ?COMPARISON:  None. ?  ?FINDINGS: ?There is no evidence of lumbar spine fracture. Alignment is normal. ?Intervertebral disc spaces are maintained. ?  ?IMPRESSION: ?Negative. ?  ?  ?Electronically Signed ?  By: M.D. ?  On: 12/13/2021 00:38 ?I, 12/15/2021, personally (independently) visualized and performed the interpretation of the images attached in this note. ? ? ? ? ?Assessment and  Plan: ?31 y.o. male with chronic low back pain ongoing for about 6 months with an acute exacerbation starting about 4 weeks ago.  The acute exacerbation is improving but the chronic pain remains.  He is a good candidate for trial of physical therapy to work on core strengthening.  If this is not sufficient to control his symptoms I would recommend an MRI as the next step.  We discussed other treatment plan and options including heating pad and a TENS unit.  Recommend holding off on muscle relaxers for now as they are sedating.  Intermittent meloxicam as needed.  This was prescribed by his PCP. ?Follow-up in about 8 weeks. ? ? ?PDMP not reviewed this encounter. ?Orders Placed This Encounter  ?Procedures  ? Ambulatory referral to Physical Therapy  ?  Referral Priority:   Routine  ?  Referral Type:   Physical Medicine  ?  Referral Reason:   Specialty Services Required  ?  Requested Specialty:   Physical Therapy  ?  Number of Visits Requested:   1  ? ?No orders of the defined types were placed in this encounter. ? ? ? ?Discussed warning signs or symptoms. Please see discharge instructions. Patient expresses understanding. ? ? ?The above documentation has been reviewed and is accurate and complete 26, M.D. ? ? ?

## 2022-01-03 ENCOUNTER — Ambulatory Visit (INDEPENDENT_AMBULATORY_CARE_PROVIDER_SITE_OTHER): Payer: Managed Care, Other (non HMO) | Admitting: Family Medicine

## 2022-01-03 ENCOUNTER — Other Ambulatory Visit: Payer: Self-pay

## 2022-01-03 ENCOUNTER — Ambulatory Visit: Payer: Managed Care, Other (non HMO) | Admitting: Sports Medicine

## 2022-01-03 ENCOUNTER — Encounter: Payer: Self-pay | Admitting: Family Medicine

## 2022-01-03 VITALS — BP 120/74 | HR 90 | Ht 73.0 in | Wt 176.4 lb

## 2022-01-03 DIAGNOSIS — M545 Low back pain, unspecified: Secondary | ICD-10-CM

## 2022-01-03 DIAGNOSIS — G8929 Other chronic pain: Secondary | ICD-10-CM

## 2022-01-03 NOTE — Patient Instructions (Addendum)
Good to see you today. ? ?I've referred you to Physical Therapy.  Their office will call you to schedule but please let us know if you haven't heard from them in one week regarding scheduling. ? ?Follow-up: 8 weeks ?

## 2022-01-10 ENCOUNTER — Other Ambulatory Visit: Payer: Self-pay | Admitting: Family Medicine

## 2022-01-10 DIAGNOSIS — M545 Low back pain, unspecified: Secondary | ICD-10-CM

## 2022-01-24 ENCOUNTER — Ambulatory Visit: Payer: Managed Care, Other (non HMO) | Admitting: Rehabilitative and Restorative Service Providers"

## 2022-01-24 ENCOUNTER — Encounter: Payer: Self-pay | Admitting: Rehabilitative and Restorative Service Providers"

## 2022-01-24 DIAGNOSIS — M6281 Muscle weakness (generalized): Secondary | ICD-10-CM | POA: Diagnosis not present

## 2022-01-24 DIAGNOSIS — R293 Abnormal posture: Secondary | ICD-10-CM

## 2022-01-24 DIAGNOSIS — M5459 Other low back pain: Secondary | ICD-10-CM

## 2022-01-24 NOTE — Therapy (Addendum)
OUTPATIENT PHYSICAL THERAPY THORACOLUMBAR EVALUATION   Patient Name: James Davila MRN: [id number] DOB:July 15, 1991, 31 y.o., male Today's Date: 01/24/2022  PHYSICAL THERAPY DISCHARGE SUMMARY  Visits from Start of Care: Eval only  Current functional level related to goals / functional outcomes: See note   Remaining deficits: See note   Education / Equipment: HEP   Patient agrees to discharge. Patient goals were  set but unable to be followed up on due to this patient not attending any visits after evaluation . Patient is being discharged due to not returning since the last visit.    PT End of Session - 01/24/22 1541     Visit Number 1    Number of Visits 8    Date for PT Re-Evaluation 03/07/22    Progress Note Due on Visit 8    PT Start Time 1028    PT Stop Time 1102    PT Time Calculation (min) 34 min    Activity Tolerance Patient tolerated treatment well;No increased pain    Behavior During Therapy James Davila for tasks assessed/performed             History reviewed. No pertinent past medical history. History reviewed. No pertinent surgical history. Patient Active Problem List   Diagnosis Date Noted   Cerumen in auditory canal on examination 12/12/2021   Anxiety with depression 06/27/2021   Chronic bilateral low back pain without sciatica 06/27/2021   Fatigue 06/27/2021   Chronic right shoulder pain 06/22/2020   Healthcare maintenance 06/22/2020    PCP: Mliss SaxKremer, William Alfred, MD  REFERRING PROVIDER: Rodolph Bongorey, Evan S, MD  REFERRING DIAG: [phone],G89.29 (ICD-10-CM) - Chronic bilateral low back pain without sciatica   THERAPY DIAG:  Abnormal posture  Muscle weakness (generalized)  Other low back pain  ONSET DATE: Previous low back pain with running.  This episode started with a combination of less physical activity, bending and twisting getting out of bed and flexed postures required at work.  SUBJECTIVE:                                                                                                                                                                                            SUBJECTIVE STATEMENT: James Davila has had previous back pain.  He has been more sedentary lately and notes getting into more flexed postures looking in a microscope at work.  No sciatica is noted but he would like to increase his physical activity level and the back pain is preventing this.    PERTINENT HISTORY:  Rare smoker  PAIN:  Are you having pain? Yes: NPRS scale: 3-4/10 Pain location: Low back Pain description: Achy,  sore and some spasm Aggravating factors: Flexion and standing (in flexion) at work Relieving factors: Movement and change of position   PRECAUTIONS: Back  WEIGHT BEARING RESTRICTIONS No  FALLS:  Has patient fallen in last 6 months? No  LIVING ENVIRONMENT: Lives with: lives with their spouse Lives in: House/apartment Stairs:  No problem with stairs Has following equipment at home: None  OCCUPATION: Works full-time  PLOF: Independent  PATIENT GOALS Pain-free work day, hitting a heavy bag, return to running   OBJECTIVE:   DIAGNOSTIC FINDINGS:  There is no evidence of lumbar spine fracture. Alignment is normal. Intervertebral disc spaces are maintained.  PATIENT SURVEYS:  FOTO 60 (Goal 84 in 8 visits)  SCREENING FOR RED FLAGS: Bowel or bladder incontinence: No Spinal tumors: No Cauda equina syndrome: No Compression fracture: No Abdominal aneurysm: No  COGNITION:  Overall cognitive status: Within functional limits for tasks assessed     SENSATION: Not tested  MUSCLE LENGTH: Hamstrings: Right 50 deg; Left 40 deg  POSTURE:  Mild protracted shoulders   LUMBAR ROM:   Active  A/PROM  01/24/2022  Flexion   Extension 30  Right lateral flexion   Left lateral flexion   Right rotation   Left rotation    (Blank rows = not tested)  LE ROM:  Passive  Right 01/24/2022 Left 01/24/2022  Hip flexion 100 105  Hip  extension    Hip abduction    Hip adduction    Hip internal rotation 9 4  Hip external rotation 50 55  Knee flexion    Knee extension    Ankle dorsiflexion    Ankle plantarflexion    Ankle inversion    Ankle eversion     (Blank rows = not tested)   TODAY'S TREATMENT  01/24/2022: Standing trunk extension 10X 3 seconds Quick review and trial of 1X (4-5X prescribed) supine hamstrings stretch, standing hip flexors stretch (foot in chair) and supine gluteal stretch (knee to opposite shoulder)  Functional Activities: Long discussion about spine anatomy, disc pressures in various positions, log and lumbar roll, the importance of avoiding flexion (and rotation with flexion) and home walking prescription.   PATIENT EDUCATION:  Education details: See above Person educated: Patient Education method: Explanation, Demonstration, Tactile cues, Verbal cues, and Handouts Education comprehension: verbalized understanding, returned demonstration, verbal cues required, tactile cues required, and needs further education   HOME EXERCISE PROGRAM: Access Code: [email or web address] URL: [email or web address] Date: 01/24/2022 Prepared by: Pauletta Browns  Exercises - Standing Lumbar Extension at Wall - Forearms  - 5 x daily - 7 x weekly - 1 sets - 5 reps - 3 seconds hold - Hip Flexor Stretch on Step  - 2-3 x daily - 7 x weekly - 1 sets - 4-5 reps - 20 seconds hold - Supine Hamstring Stretch  - 2-3 x daily - 7 x weekly - 1 sets - 4-5 reps - 20 seconds hold - Supine Gluteus Stretch  - 2-3 x daily - 7 x weekly - 1 sets - 4-5 reps - 20 seconds hold  ASSESSMENT:  CLINICAL IMPRESSION: Patient is a 31 y.o. male who was seen today for physical therapy evaluation and treatment for L sided low back pain.  James Davila has been more sedentary lately and it is likely than a combination of poor body mechanics, deconditioning and prolonged postures have given him an acute episode of low back pain.  We had a long  discussion today about how the spine works, basic posture and body mechanics and how  to avoid future episodes.  He will also benefit from low back strengthening but we were unable to start this today as he was late to his appointment.  His prognosis to meet long-term goals is good with the recommended POC.   OBJECTIVE IMPAIRMENTS decreased activity tolerance, decreased endurance, decreased knowledge of condition, decreased ROM, decreased strength, decreased safety awareness, impaired perceived functional ability, increased muscle spasms, impaired flexibility, improper body mechanics, postural dysfunction, and pain.   ACTIVITY LIMITATIONS occupation and working out .   PERSONAL FACTORS  No personal factors  are affecting patient's functional outcome.    REHAB POTENTIAL: Excellent  CLINICAL DECISION MAKING: Stable/uncomplicated  EVALUATION COMPLEXITY: Low   GOALS: Goals reviewed with patient? Yes  SHORT TERM GOALS: Target date: 02/07/2022  James Davila will be independent with his day 1 HEP. Baseline: Started 01/24/2022 Goal status: INITIAL  2.  James Davila will be able to demonstrate correct posture and body mechanics. Baseline: Started education 01/24/2022 Goal status: INITIAL   LONG TERM GOALS: Target date: 03/07/2022  James Davila will improve his FOTO score to at least 84. Baseline: 60 Goal status: INITIAL  2.  James Davila will report low back pain as consistently 0-1/10 on the Numeric Pain Rating Scale. Baseline: 3-4/10 Goal status: INITIAL  3.  Improve hip flexors flexibility to 120; hamstrings to 60 and hip IR to 10 degrees. Baseline: L/R 105/110; 40/50 and 4/9, respectively Goal status: INITIAL  4.  Improve spine strength as assessed by the ability to maintain the spine strength test for 60 seconds. Baseline: Deferred due to time. Goal status: INITIAL  5.  James Davila will be independent with his long-term HEP at DC. Baseline: Started 01/24/2022 Goal status: INITIAL    PLAN: PT  FREQUENCY:  1-4 visits over the next 6 weeks  PT DURATION: 6 weeks  PLANNED INTERVENTIONS: Therapeutic exercises, Therapeutic activity, Patient/Family education, Cryotherapy, and Manual therapy.  PLAN FOR NEXT SESSION: Prone strengthening (opposite arm and leg extension, superman), review HEP, posture and practical body mechanics education.  Possible DC in 1-2 visits.   Cherlyn Cushing, PT, MPT 01/24/2022, 3:43 PM

## 2022-02-14 ENCOUNTER — Encounter: Payer: Managed Care, Other (non HMO) | Admitting: Rehabilitative and Restorative Service Providers"

## 2022-02-14 ENCOUNTER — Telehealth: Payer: Self-pay | Admitting: Rehabilitative and Restorative Service Providers"

## 2022-02-14 NOTE — Telephone Encounter (Signed)
Called James Davila about his no show 03/08/2022.  He mentioned he had a dead car battery so I gave him the [phone] number to call and reschedule. ?

## 2022-02-20 NOTE — Progress Notes (Signed)
? ?I, Wendy Poet, LAT, ATC, am serving as scribe for Dr. Lynne Leader. ? ?James Davila is a 31 y.o. male who presents to [place] at [facility] today for f/u of chronic LBP.  He was last seen by Dr. Georgina Snell on 01/03/22 and was referred to PT of which he's completed 1 visit and no-showed 1 visit.  Today, pt reports low back is feeling much better, 80-90% improvement. Pt did not find the PT stretches helpful, and didn't do as much HEP as recommended. ? ?Pt also c/o R shoulder pain that started aching on Tuesday, 4/25 after rock climbing and playing disc golf.  He has had this pain off and on since 2021.  He notes popping and clicking in his shoulder and a feeling of instability with dynamic activities such as throwing a disc golf disc or rockclimbing.  He is done continued shoulder stabilization exercises that he was taught in physical therapy since 2021 and still has pain and instability that limits his ability to do these activities fully.  He notes this is quite bothersome and he at this point is ready to do something about it if needed.  He is willing to consider surgery. ? ?Diagnostic testing: L-spine MRI ordered but not yet completed; L-spine XR- 12/12/21 ? ?Pertinent review of systems: No fevers or chills ? ?Relevant historical information: Otherwise healthy ? ? ?Exam:  ?BP 112/74   Pulse 83   Ht 6\' 1"  (1.854 m)   Wt 180 lb (81.6 kg)   SpO2 99%   BMI 23.75 kg/m?  ?General: Well Developed, well nourished, and in no acute distress.  ? ?MSK: Right shoulder normal. ?Range of motion: Abduction normal. ?External rotation normal. ?Internal rotation lumbar spine.  Left internal rotation is to thoracic spine.Marland Kitchen ?Strength intact. ?Negative Hawkins and Neer's test.  Negative empty can test. ?Mildly positive O'Brien's test. ?Positive clunk test. ?Positive apprehension test. ?Negative Yergason's and speeds test. ? ? ? ?Lab and Radiology Results ?EXAM: ?RIGHT SHOULDER - 2+ VIEW ?  ?COMPARISON:   None. ?  ?FINDINGS: ?There is no evidence of fracture or dislocation. There is no ?evidence of arthropathy or other focal bone abnormality. Soft ?tissues are unremarkable. ?  ?IMPRESSION: ?Negative. ?  ?  ?Electronically Signed ?  By: Titus Dubin M.D. ?  On: 07/05/2020 15:15 ?I, Lynne Leader, personally (independently) visualized and performed the interpretation of the images attached in this note. ? ? ? ? ? ?Assessment and Plan: ?31 y.o. male with persistent right shoulder pain and dysfunction with some instability.  This is concerning for labrum tear.  This pain has been ongoing for the last 2 years and is now limiting his ability to do activities that are important to him such as rockclimbing and throwing a disc golf disc fully.  He has mechanical symptoms and physical exam signs concerning for labrum tear that are positive on exam.  He has tried physical therapy and continued home exercise program.  Plan at this time for MRI arthrogram.  Also will reteach home exercise program in clinic today by ATC. ? ?Recheck after MRI arthrogram.  May refer to surgery based on results. ? ? ?PDMP not reviewed this encounter. ?Orders Placed This Encounter  ?Procedures  ? MR Shoulder Right W Contrast  ?  Standing Status:   Future  ?  Standing Expiration Date:   02/22/2023  ?  Scheduling Instructions:  ?   Schedule w/ Dr. Darene Lamer 1 hour prior for injection.  ?  Order  Specific Question:   Reason for Exam (SYMPTOM  OR DIAGNOSIS REQUIRED)  ?  Answer:   chronic right shoulder pain  ?  Order Specific Question:   If indicated for the ordered procedure, I authorize the administration of contrast media per Radiology protocol  ?  Answer:   Yes  ?  Order Specific Question:   What is the patient's sedation requirement?  ?  Answer:   No Sedation  ?  Order Specific Question:   Does the patient have a pacemaker or implanted devices?  ?  Answer:   No  ?  Order Specific Question:   Preferred imaging location?  ?  Answer:   Product/process development scientist  (table limit-350lbs)  ? ?No orders of the defined types were placed in this encounter. ? ? ? ?Discussed warning signs or symptoms. Please see discharge instructions. Patient expresses understanding. ? ? ?The above documentation has been reviewed and is accurate and complete Lynne Leader, M.D. ? ? ?

## 2022-02-21 ENCOUNTER — Ambulatory Visit (INDEPENDENT_AMBULATORY_CARE_PROVIDER_SITE_OTHER): Payer: Managed Care, Other (non HMO) | Admitting: Family Medicine

## 2022-02-21 VITALS — BP 112/74 | HR 83 | Ht 73.0 in | Wt 180.0 lb

## 2022-02-21 DIAGNOSIS — M25311 Other instability, right shoulder: Secondary | ICD-10-CM | POA: Diagnosis not present

## 2022-02-21 NOTE — Patient Instructions (Addendum)
Thank you for coming in today.  ? ?You should hear from MRI scheduling within 1 week. If you do not hear please let me know.   ? ?Please complete the exercises that the athletic trainer went over with you:  View at [email or web address] using code: [email or web address] ? ?Recheck after MRI ? ?

## 2022-02-28 ENCOUNTER — Ambulatory Visit: Payer: Managed Care, Other (non HMO) | Admitting: Family Medicine

## 2022-03-10 ENCOUNTER — Ambulatory Visit: Payer: Managed Care, Other (non HMO) | Admitting: Sports Medicine

## 2022-04-16 IMAGING — DX DG LUMBAR SPINE COMPLETE 4+V
4 series · 4 of 4 positions shown · non-contrast
Comparison: None.

CLINICAL DATA: Chronic low back pain, predominantely to the left.

EXAM:
LUMBAR SPINE - COMPLETE 4+ VIEW

[lumbar spine ap]
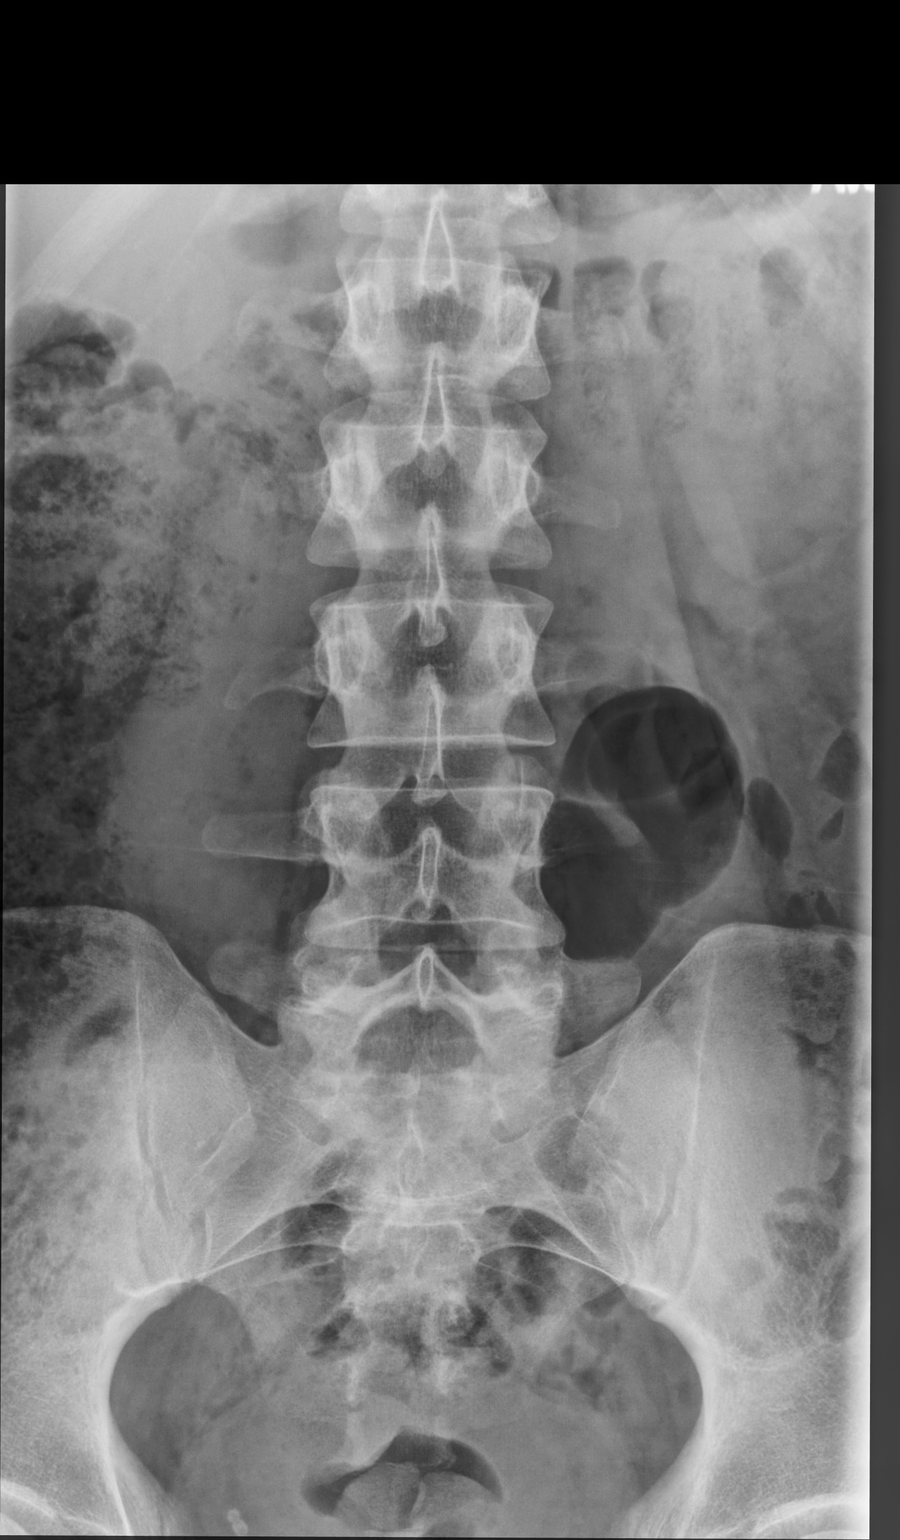

[lumbar spine lmo (1 of 2)]
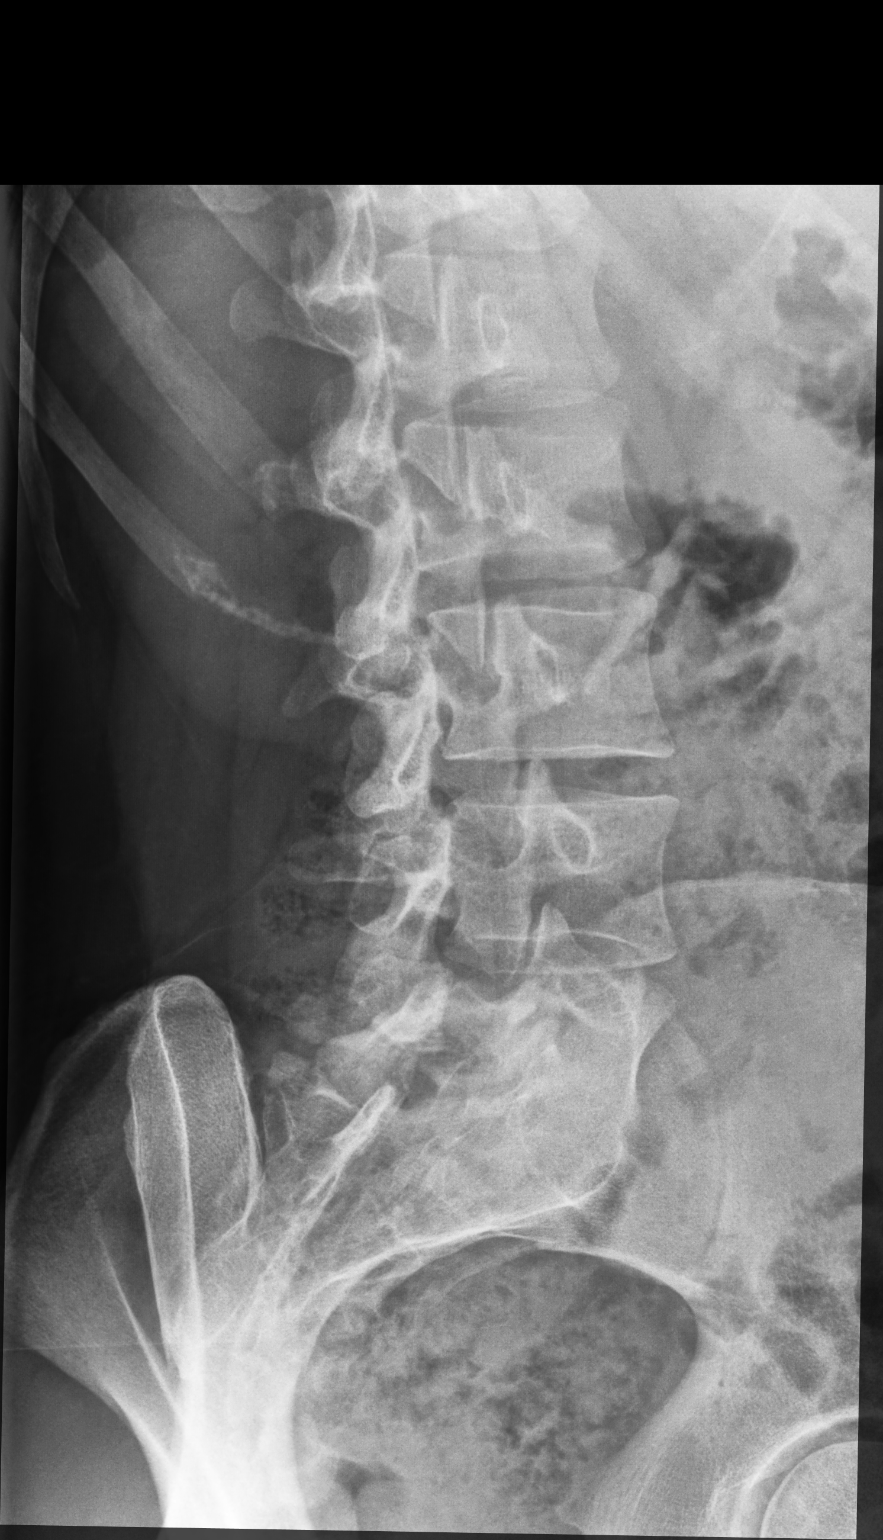

[lumbar spine lmo (2 of 2)]
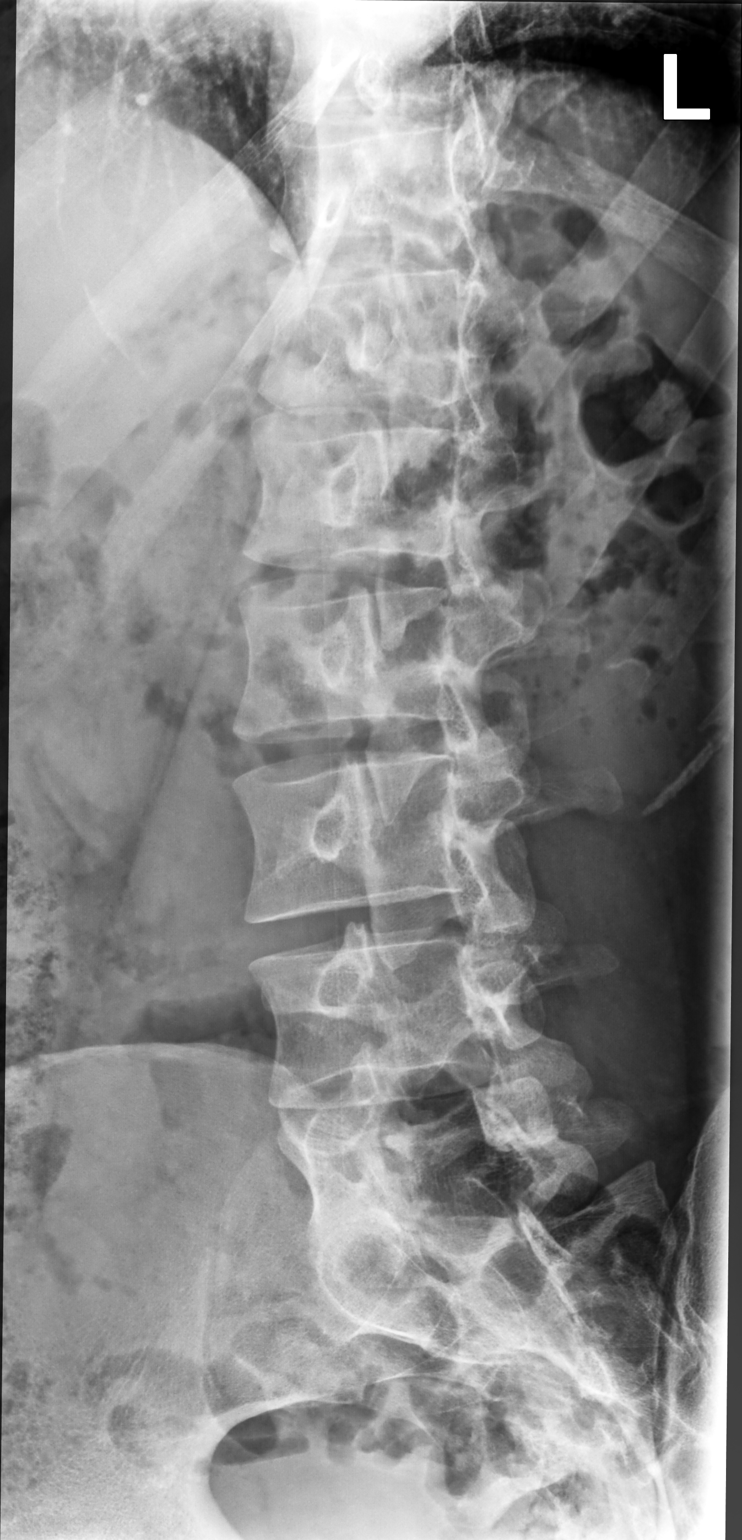

[lumbar spine lat]
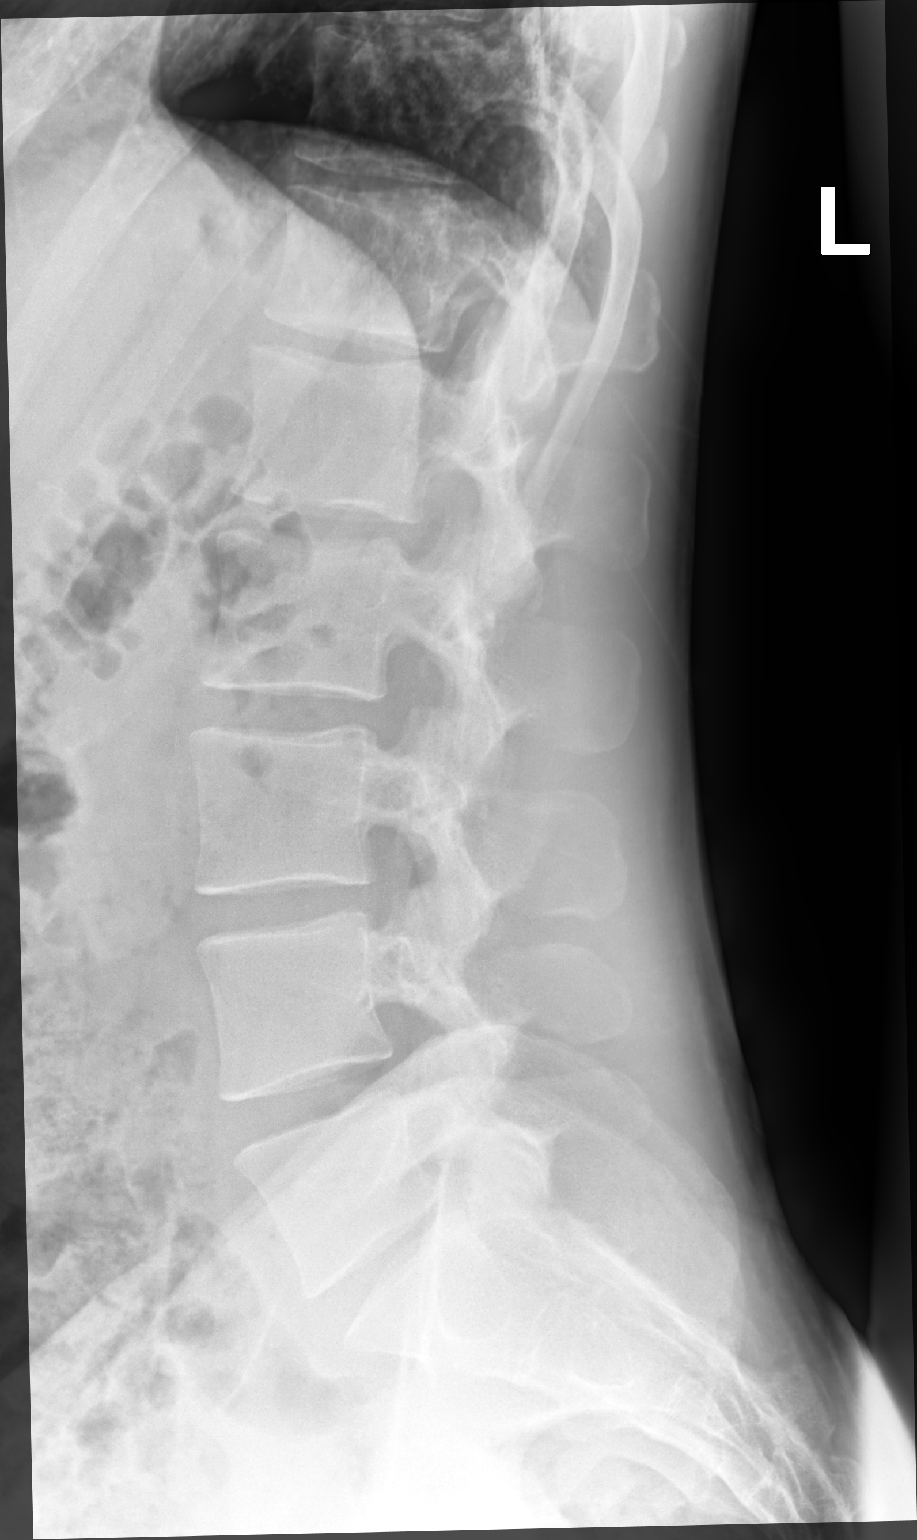

[4 of 4 positions shown; findings below may reference images not displayed]

FINDINGS: There is no evidence of lumbar spine fracture. Alignment is normal.
Intervertebral disc spaces are maintained.
IMPRESSION: Negative.

## 2022-06-11 ENCOUNTER — Telehealth: Payer: Self-pay | Admitting: Family Medicine

## 2022-06-11 NOTE — Telephone Encounter (Signed)
Patient will need to fill out release of information form with new Provider who he chooses to have medical records sent over.

## 2022-06-11 NOTE — Telephone Encounter (Signed)
Pt stated that he would like to do TOC from dr Doreene Burke to [facility] dr. Irving Shows sagardia or his second choice is dr.[email or web address] plotnikov. the patient stated that he no longer want dr Doreene Burke to be his PCP

## 2022-06-12 ENCOUNTER — Ambulatory Visit: Payer: Managed Care, Other (non HMO) | Admitting: Family Medicine

## 2024-06-28 ENCOUNTER — Encounter: Payer: Self-pay | Admitting: Sports Medicine
